# Patient Record
Sex: Female | Born: 1975 | Race: White | Hispanic: No | Marital: Married | Smoking: Never smoker
Health system: Southern US, Community
[De-identification: ages and names within clinical notes are randomized; demographics above are authoritative.]

---

## 2016-01-28 ENCOUNTER — Ambulatory Visit: Payer: Self-pay | Admitting: Family Medicine

## 2016-05-01 ENCOUNTER — Ambulatory Visit: Payer: Self-pay | Admitting: Family Medicine

## 2016-05-09 ENCOUNTER — Ambulatory Visit (INDEPENDENT_AMBULATORY_CARE_PROVIDER_SITE_OTHER): Payer: Self-pay | Admitting: Primary Care

## 2016-05-09 ENCOUNTER — Encounter: Payer: Self-pay | Admitting: Primary Care

## 2016-05-09 ENCOUNTER — Other Ambulatory Visit: Payer: Self-pay | Admitting: Primary Care

## 2016-05-09 VITALS — BP 108/70 | HR 76 | Temp 98.4°F | Ht 63.5 in | Wt 139.8 lb

## 2016-05-09 DIAGNOSIS — Z0001 Encounter for general adult medical examination with abnormal findings: Secondary | ICD-10-CM | POA: Insufficient documentation

## 2016-05-09 DIAGNOSIS — Z803 Family history of malignant neoplasm of breast: Secondary | ICD-10-CM

## 2016-05-09 DIAGNOSIS — Z0000 Encounter for general adult medical examination without abnormal findings: Secondary | ICD-10-CM

## 2016-05-09 DIAGNOSIS — Z1239 Encounter for other screening for malignant neoplasm of breast: Secondary | ICD-10-CM

## 2016-05-09 LAB — TSH: TSH: 0.81 u[IU]/mL (ref 0.35–4.50)

## 2016-05-09 LAB — COMPREHENSIVE METABOLIC PANEL
ALBUMIN: 4.8 g/dL (ref 3.5–5.2)
ALK PHOS: 43 U/L (ref 39–117)
ALT: 12 U/L (ref 0–35)
AST: 20 U/L (ref 0–37)
BILIRUBIN TOTAL: 0.5 mg/dL (ref 0.2–1.2)
BUN: 17 mg/dL (ref 6–23)
CO2: 29 mEq/L (ref 19–32)
Calcium: 9.8 mg/dL (ref 8.4–10.5)
Chloride: 103 mEq/L (ref 96–112)
Creatinine, Ser: 0.84 mg/dL (ref 0.40–1.20)
GFR: 80.37 mL/min (ref >60.00)
GLUCOSE: 87 mg/dL (ref 70–99)
Potassium: 4.6 mEq/L (ref 3.5–5.1)
Sodium: 139 mEq/L (ref 135–145)
TOTAL PROTEIN: 7.7 g/dL (ref 6.0–8.3)

## 2016-05-09 NOTE — Progress Notes (Signed)
Subjective:    Patient ID: Makayla Perry, female    DOB: [birth year], 40 y.o.   MRN: [id number]  HPI  Makayla Perry is a 40 year old female who presents today to establish care and for complete physical. Will obtain old records.  1) Family History of Breast Cancer: Mother diagnosed with breast cancer at age 38. Patient completed mammogram at age 40. She completed genetic testing 3 years ago which was negative.   Immunizations: -Tetanus: Unsure, believes it's been over 10 years. -Influenza: Declines   Diet: She endorses a healthy diet. Breakfast: Hard boiled eggs, fruit Lunch: Chicken meat, vegetables, fruit Dinner: Chicken and broccoli  Snacks: Trail mix bar, protein shake, chips Desserts: Ice cream 3-4 times weekly Beverages: Unsweet tea, water, occasional diet coke  Exercise: She works out 5 days weekly Eye exam: Completed in 2016, no changes in vision Dental exam: Completes semi-annually Pap Smear: Completed in 2014, normal. Due and reschedule. Mammogram: Completed 2009, normal.   Review of Systems  Constitutional: Negative for unexpected weight change.  HENT: Negative for rhinorrhea.   Respiratory: Negative for cough and shortness of breath.   Cardiovascular: Negative for chest pain.  Gastrointestinal: Negative for constipation and diarrhea.  Genitourinary: Negative for difficulty urinating and menstrual problem.  Musculoskeletal: Negative for arthralgias and myalgias.  Skin: Negative for rash.  Allergic/Immunologic: Negative for environmental allergies.  Neurological: Negative for dizziness, numbness and headaches.  Psychiatric/Behavioral:       Denies concerns for anxiety or depression       No past medical history on file.   Social History   Social History  . Marital status: Married    Spouse name: N/A  . Number of children: N/A  . Years of education: N/A   Occupational History  . Not on file.   Social History Main Topics  . Smoking status: Never  Smoker  . Smokeless tobacco: Not on file  . Alcohol use Yes  . Drug use: Unknown  . Sexual activity: Not on file   Other Topics Concern  . Not on file   Social History Narrative  . No narrative on file    No past surgical history on file.  Family History  Problem Relation Age of Onset  . Breast cancer Mother   . Alcohol abuse Paternal Grandfather   . Lung cancer Paternal Grandfather     No Known Allergies  No current outpatient prescriptions on file prior to visit.   No current facility-administered medications on file prior to visit.     BP 108/70   Pulse 76   Temp 98.4 F (36.9 C) (Oral)   Ht 5' 3.5" (1.613 m)   Wt 139 lb 12.8 oz (63.4 kg)   LMP 05/06/2016   SpO2 97%   BMI 24.38 kg/m    Objective:   Physical Exam  Constitutional: She is oriented to person, place, and time. She appears well-nourished.  HENT:  Right Ear: Tympanic membrane and ear canal normal.  Left Ear: Tympanic membrane and ear canal normal.  Nose: Nose normal.  Mouth/Throat: Oropharynx is clear and moist.  Eyes: Conjunctivae and EOM are normal. Pupils are equal, round, and reactive to light.  Neck: Neck supple. No thyromegaly present.  Cardiovascular: Normal rate and regular rhythm.   No murmur heard. Pulmonary/Chest: Effort normal and breath sounds normal. She has no rales.  Abdominal: Soft. Bowel sounds are normal. There is no tenderness.  Musculoskeletal: Normal range of motion.  Lymphadenopathy:    She  has no cervical adenopathy.  Neurological: She is alert and oriented to person, place, and time. She has normal reflexes. No cranial nerve deficit.  Skin: Skin is warm and dry. No rash noted.  Psychiatric: She has a normal mood and affect.          Assessment & Plan:

## 2016-05-09 NOTE — Assessment & Plan Note (Signed)
Mother diagnosed at age 38. Patient completed mammogram at age 40, normal. Genetic testing completed 3 years ago, negative. Will review current breast cancer screening guidelines to determine need for repeat mammogram.

## 2016-05-09 NOTE — Progress Notes (Signed)
Pre visit review using our clinic review tool, if applicable. No additional management support is needed unless otherwise documented below in the visit note. 

## 2016-05-09 NOTE — Patient Instructions (Addendum)
Continue your efforts towards a healthy lifestyle through healthy diet and routine exercise.  Take the prescription for a tetanus vaccination to [facility].  Schedule your Pap exam at your convenience.  Ensure you are consuming 64 ounces of water daily.  Continue exercising. You should be getting 150 minutes of moderate intensity exercise weekly.  It was a pleasure to meet you today! Please don't hesitate to call me with any questions. Welcome to [facility]!

## 2016-05-09 NOTE — Assessment & Plan Note (Signed)
Td due, Rx provided today. Pap due, patient will schedule second office visit per her preference. Mammogram likely due given family history of breast cancer, will order if necessary. Exam unremarkable. Labs pending. Commended her on her healthy lifestyle. Follow up in 1 year for repeat physical.

## 2016-05-29 ENCOUNTER — Other Ambulatory Visit (HOSPITAL_COMMUNITY)
Admission: RE | Admit: 2016-05-29 | Discharge: 2016-05-29 | Disposition: A | Discharge status: Home / Self Care | Payer: Self-pay | Source: Ambulatory Visit | Attending: Primary Care | Admitting: Primary Care

## 2016-05-29 ENCOUNTER — Encounter: Payer: Self-pay | Admitting: Primary Care

## 2016-05-29 ENCOUNTER — Ambulatory Visit (INDEPENDENT_AMBULATORY_CARE_PROVIDER_SITE_OTHER): Payer: Self-pay | Admitting: Primary Care

## 2016-05-29 VITALS — BP 110/70 | HR 80 | Temp 98.2°F | Ht 64.0 in | Wt 140.8 lb

## 2016-05-29 DIAGNOSIS — Z01411 Encounter for gynecological examination (general) (routine) with abnormal findings: Secondary | ICD-10-CM | POA: Insufficient documentation

## 2016-05-29 DIAGNOSIS — Z1151 Encounter for screening for human papillomavirus (HPV): Secondary | ICD-10-CM | POA: Insufficient documentation

## 2016-05-29 DIAGNOSIS — Z124 Encounter for screening for malignant neoplasm of cervix: Secondary | ICD-10-CM

## 2016-05-29 NOTE — Patient Instructions (Signed)
We will notify you of your pap results once reviewed in 2-3 days.  I will notify you of your mammogram once completed.  Continue monthly self breast exams.  It was a pleasure to see you today!

## 2016-05-29 NOTE — Progress Notes (Signed)
   Subjective:    Patient ID: Makayla Perry, female    DOB: 1976-05-17, 40 y.o.   MRN: [id number]  HPI  Makayla Perry is a 40 year old female who presents today for her pap. She completed her annual physical exam on 05/09/16. She denies vaginal discharge, vaginal itching, urinary symptoms. She endorses regular periods. She will be completing her mammogram soon as she has a family history of breast cancer in her mother. She does complete monthly self breast exams. She otherwise has no complaints.  Review of Systems  Constitutional: Negative for fatigue.  Respiratory: Negative for shortness of breath.   Cardiovascular: Negative for chest pain.  Genitourinary: Negative for dysuria, frequency, menstrual problem, pelvic pain, vaginal bleeding and vaginal discharge.       No past medical history on file.   Social History   Social History  . Marital status: Married    Spouse name: N/A  . Number of children: N/A  . Years of education: N/A   Occupational History  . Not on file.   Social History Main Topics  . Smoking status: Never Smoker  . Smokeless tobacco: Not on file  . Alcohol use Yes  . Drug use: Unknown  . Sexual activity: Not on file   Other Topics Concern  . Not on file   Social History Narrative  . No narrative on file    No past surgical history on file.  Family History  Problem Relation Age of Onset  . Breast cancer Mother   . Alcohol abuse Paternal Grandfather   . Lung cancer Paternal Grandfather     No Known Allergies  No current outpatient prescriptions on file prior to visit.   No current facility-administered medications on file prior to visit.     BP 110/70   Pulse 80   Temp 98.2 F (36.8 C) (Oral)   Ht 5\' 4"  (1.626 m)   Wt 140 lb 12.8 oz (63.9 kg)   LMP 05/06/2016   SpO2 97%   BMI 24.17 kg/m    Objective:   Physical Exam  Constitutional: She appears well-nourished.  Cardiovascular: Normal rate and regular rhythm.   Pulmonary/Chest:  Effort normal and breath sounds normal.  Genitourinary: No breast tenderness. There is no lesion on the right labia. There is no lesion on the left labia. Cervix exhibits no motion tenderness and no discharge. Right adnexum displays no tenderness. Left adnexum displays no tenderness. No vaginal discharge found.          Assessment & Plan:  Encounter for Pap:  Completed 3 years ago, normal. No history of abnormal paps. Will complete mammogram soon given family history of breast cancer. Exam today unremarkable. Stressed importance of continued monthly self breast exams. If Pap negative, will repeat in 3 years.  Morrie Sheldonlark,Katherine Kendal, NP

## 2016-05-29 NOTE — Progress Notes (Signed)
Pre visit review using our clinic review tool, if applicable. No additional management support is needed unless otherwise documented below in the visit note. 

## 2016-05-29 NOTE — Addendum Note (Signed)
Addended by: Tawnya CrookSAMBATH, Jesson Foskey on: 05/29/2016 03:30 PM   Modules accepted: Orders

## 2016-05-31 LAB — CYTOLOGY - PAP
Diagnosis: NEGATIVE
HPV: NOT DETECTED

## 2016-07-04 ENCOUNTER — Other Ambulatory Visit: Payer: Self-pay | Admitting: Primary Care

## 2016-07-04 ENCOUNTER — Ambulatory Visit
Admission: RE | Admit: 2016-07-04 | Discharge: 2016-07-04 | Disposition: A | Discharge status: Home / Self Care | Payer: Self-pay | Source: Ambulatory Visit | Attending: Primary Care | Admitting: Primary Care

## 2016-07-04 DIAGNOSIS — Z1231 Encounter for screening mammogram for malignant neoplasm of breast: Secondary | ICD-10-CM | POA: Insufficient documentation

## 2016-07-04 DIAGNOSIS — R928 Other abnormal and inconclusive findings on diagnostic imaging of breast: Secondary | ICD-10-CM

## 2016-07-04 DIAGNOSIS — N6310 Unspecified lump in the right breast, unspecified quadrant: Secondary | ICD-10-CM

## 2016-07-04 DIAGNOSIS — Z1239 Encounter for other screening for malignant neoplasm of breast: Secondary | ICD-10-CM

## 2016-07-21 ENCOUNTER — Ambulatory Visit
Admission: RE | Admit: 2016-07-21 | Discharge: 2016-07-21 | Disposition: A | Discharge status: Home / Self Care | Payer: Self-pay | Source: Ambulatory Visit | Attending: Primary Care | Admitting: Primary Care

## 2016-07-21 DIAGNOSIS — R928 Other abnormal and inconclusive findings on diagnostic imaging of breast: Secondary | ICD-10-CM | POA: Insufficient documentation

## 2016-07-21 DIAGNOSIS — N6310 Unspecified lump in the right breast, unspecified quadrant: Secondary | ICD-10-CM

## 2017-02-10 IMAGING — MG MM DIGITAL SCREENING BILAT W/ CAD
4 series · 4 of 4 positions shown · non-contrast
Comparison: None.

CLINICAL DATA: Screening.

EXAM:
DIGITAL SCREENING BILATERAL MAMMOGRAM WITH CAD

[R MLO]
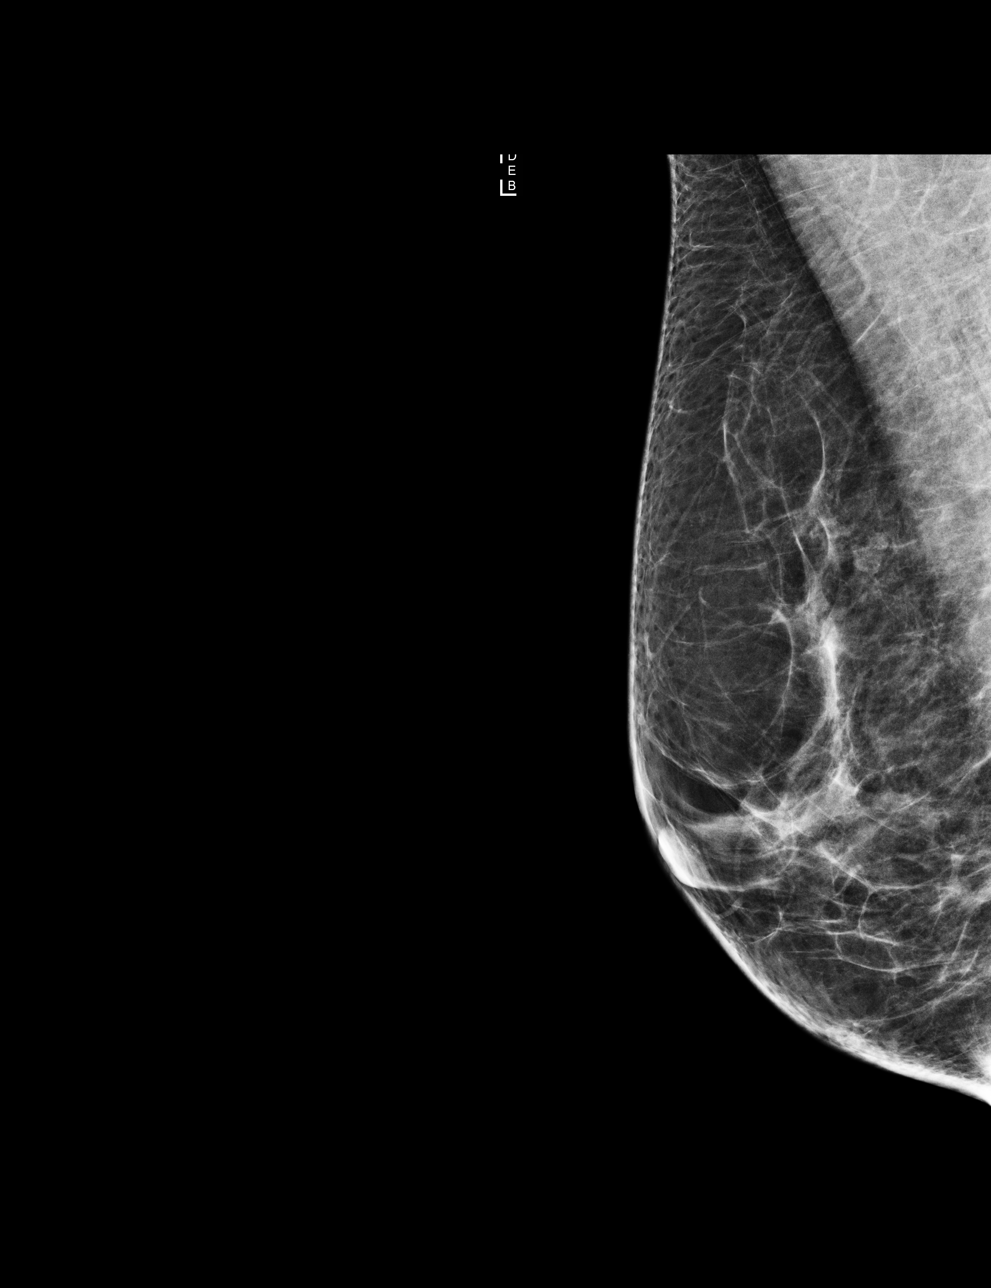

[L CC]
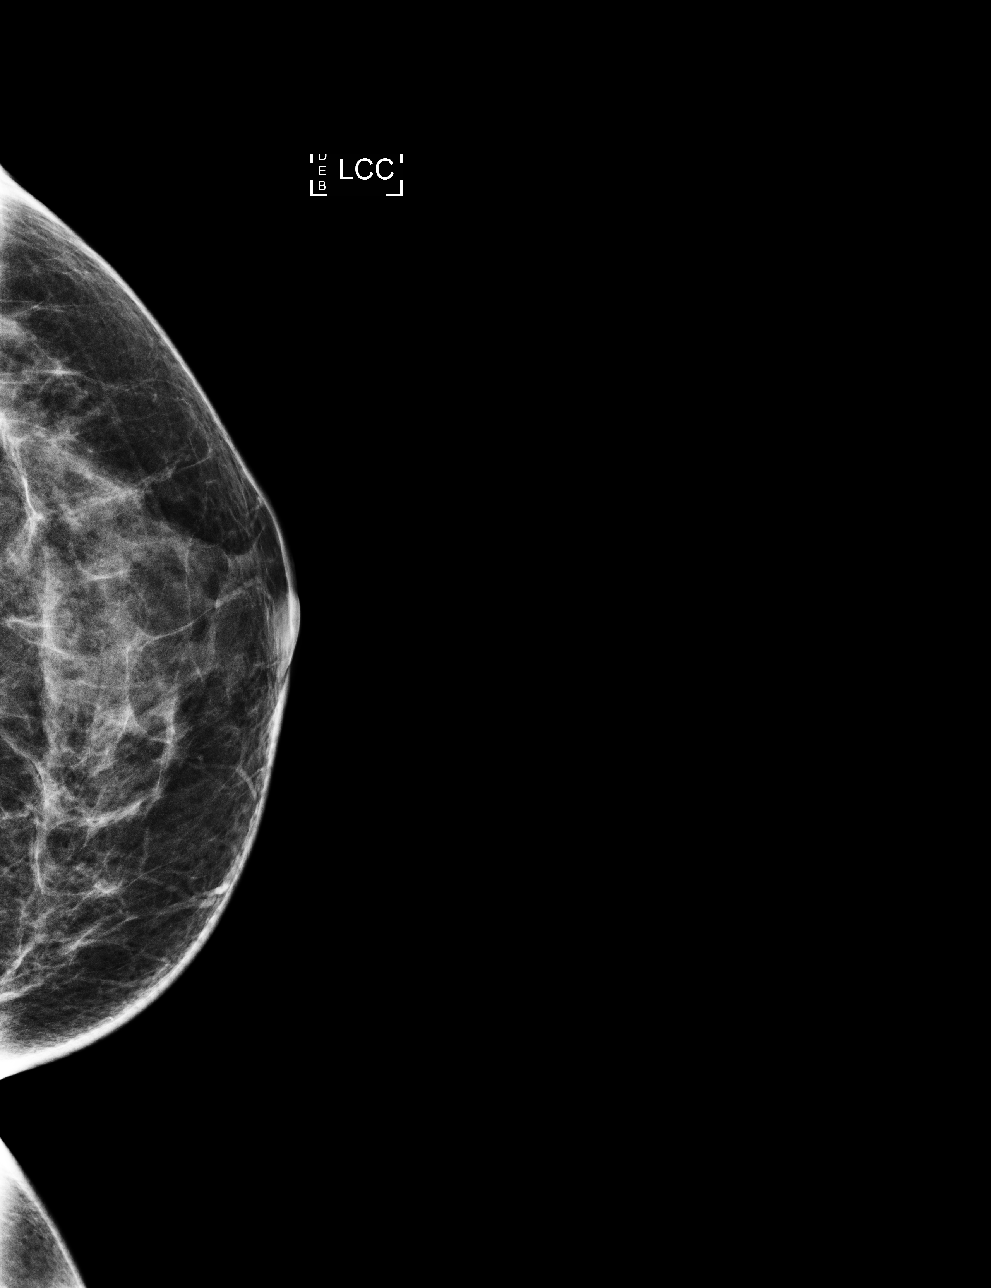

[L MLO]
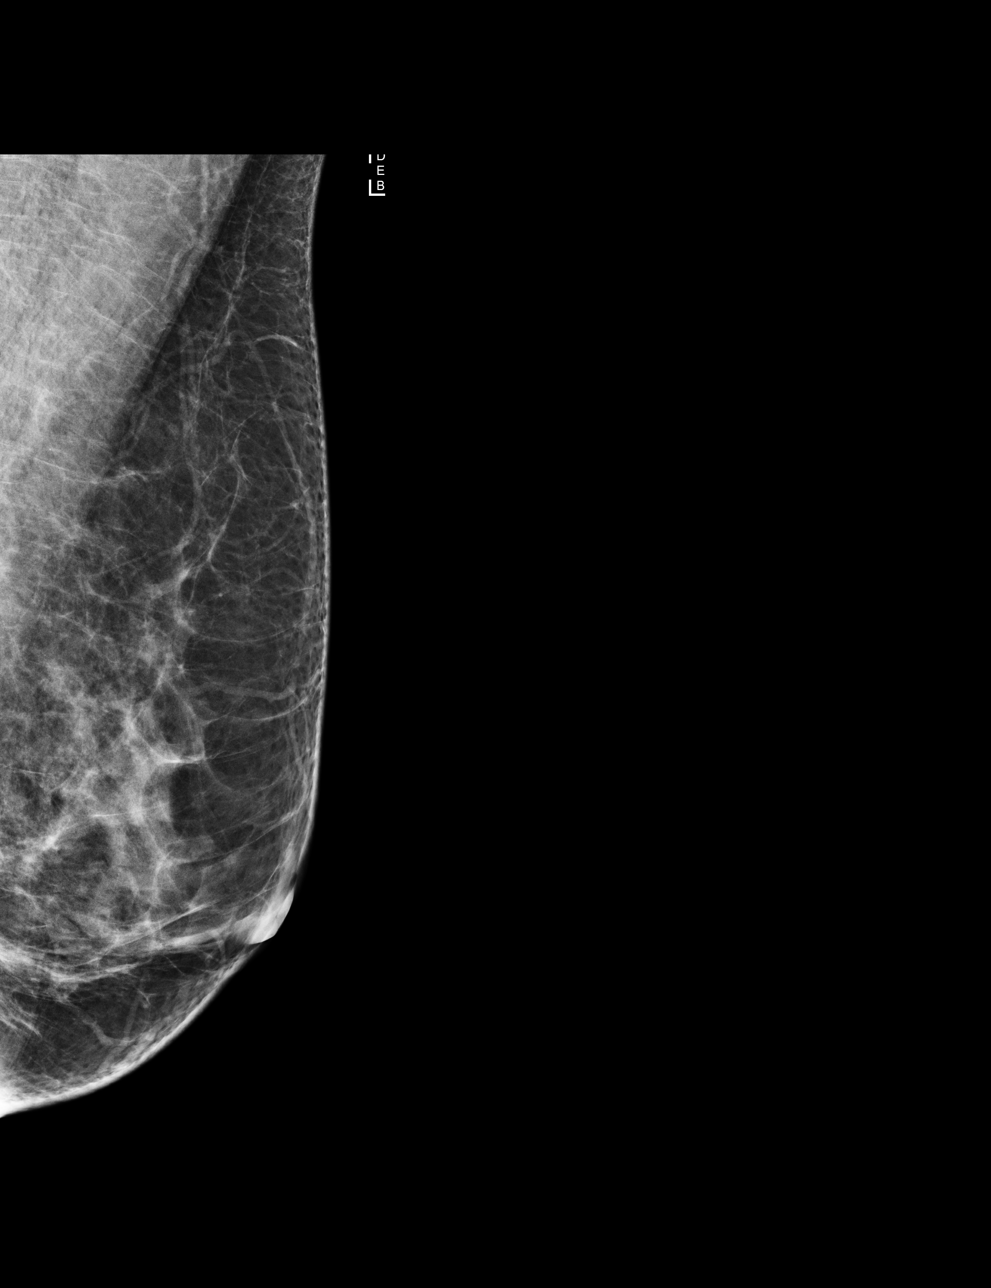

[R CC]
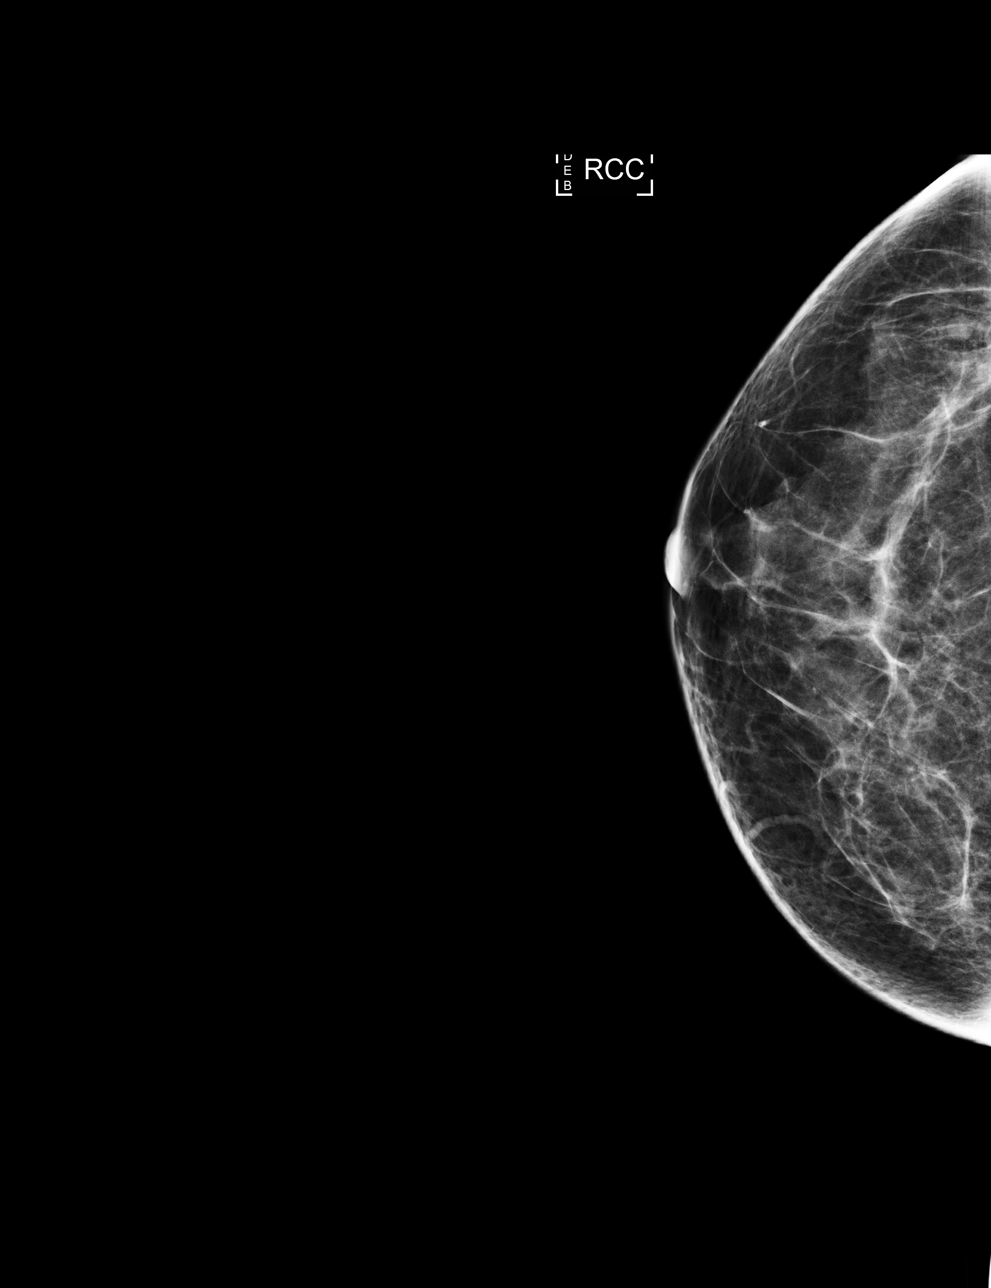

[4 of 4 positions shown; findings below may reference images not displayed]

ACR Breast Density Category c: The breast tissue is heterogeneously
dense, which may obscure small masses.
FINDINGS: In the right breast, a possible mass warrants further evaluation. In
the left breast, no findings suspicious for malignancy. Images were
processed with CAD.
IMPRESSION: Further evaluation is suggested for possible mass in the right
breast.

RECOMMENDATION:
Diagnostic mammogram and possibly ultrasound of the right breast.
(Code:1X-6-RRN)

The patient will be contacted regarding the findings, and additional
imaging will be scheduled.

BI-RADS CATEGORY  0: Incomplete. Need additional imaging evaluation
and/or prior mammograms for comparison.

## 2017-09-24 ENCOUNTER — Telehealth: Payer: Self-pay | Admitting: Primary Care

## 2017-09-24 DIAGNOSIS — Z1239 Encounter for other screening for malignant neoplasm of breast: Secondary | ICD-10-CM

## 2017-09-24 NOTE — Telephone Encounter (Signed)
Copied from CRM [id number]. Topic: Inquiry >> Sep 24, 2017  2:46 PM Maia Pettiesrtiz, Kristie S wrote: Reason for CRM: pt is due for MM and was [facility] that order needs entered by PCP. Please advise.

## 2017-09-24 NOTE — Telephone Encounter (Signed)
I tried call Makayla Perry busy

## 2017-09-25 NOTE — Telephone Encounter (Signed)
I spoke with [place].  Pt needs order [id number] for mammogram

## 2017-09-25 NOTE — Telephone Encounter (Signed)
Have not seen patient in 2 years, please set up for CPE. Mammogram orders placed.

## 2017-09-25 NOTE — Telephone Encounter (Signed)
Mammogram 3/19 cpx 11/11 Pt aware

## 2017-10-09 ENCOUNTER — Ambulatory Visit
Admission: RE | Admit: 2017-10-09 | Discharge: 2017-10-09 | Disposition: A | Discharge status: Home / Self Care | Payer: Self-pay | Source: Ambulatory Visit | Attending: Primary Care | Admitting: Primary Care

## 2017-10-09 DIAGNOSIS — Z1239 Encounter for other screening for malignant neoplasm of breast: Secondary | ICD-10-CM

## 2017-10-09 DIAGNOSIS — Z1231 Encounter for screening mammogram for malignant neoplasm of breast: Secondary | ICD-10-CM | POA: Insufficient documentation

## 2018-01-02 ENCOUNTER — Ambulatory Visit: Payer: Self-pay

## 2018-01-02 NOTE — Telephone Encounter (Signed)
Noted and agree with recommendations.  

## 2018-01-02 NOTE — Telephone Encounter (Signed)
Husband called to report an allergic reaction.  Stated that pt. took dose of Allegra D at approx. 9:00 AM.  Stated about 9:40 AM, she started having rash on face and neck, with swelling of eyes and lips, and tongue.  Reported wife is with him in the car.  Advised she needs to take a dose of Benadryl right away.  Reported she just took the Benadryl.  Questioned if she is breathing okay, or in any distress?  Per husband, "she is fine"  Advised to go to nearest ER or UC.  Husband agreed, and verb. understanding of the immediate need for her evaluation.     Reason for Disposition . Swollen tongue  Answer Assessment - Initial Assessment Questions 1. SYMPTOMS: "Do you have any symptoms?"     Rash on face and neck with mouth and tongue swelling 2. SEVERITY: If symptoms are present, ask "Are they mild, moderate or severe?"     Reported reaction to Allegra D.  Took medication at 9:00 AM and had onset of rash of face and neck with swelling of eyes, mouth and tongue at 9:40 AM  Answer Assessment - Initial Assessment Questions 1. APPEARANCE of RASH: "Describe the rash." (e.g., spots, blisters, raised areas, skin peeling, scaly)     Rash of face, neck, with swelling of eyes, mouth, tongue 2. SIZE: "How big are the spots?" (e.g., tip of pen, eraser, coin; inches, centimeters)     small 3. LOCATION: "Where is the rash located?"     Face, neck,  4. COLOR: "What color is the rash?" (Note: It is difficult to assess rash color in people with darker-colored skin. When this situation occurs, simply ask the caller to describe what they see.)     red 5. ONSET: "When did the rash begin?"     About 9:40 AM 6. FEVER: "Do you have a fever?" If so, ask: "What is your temperature, how was it measured, and when did it start?"    Not asked 7. ITCHING: "Does the rash itch?" If so, ask: "How bad is the itch?" (Scale 1-10; or mild, moderate, severe)     Not asked 8. CAUSE: "What do you think is causing the rash?"  Recently took one dose of Allegra D 9. NEW MEDICATION: "What new medication are you taking?" (e.g., name of antibiotic) "When did you start taking this medication?".     Allegra D 10. OTHER SYMPTOMS: "Do you have any other symptoms?" (e.g., sore throat, fever, joint pain)       Swelling of eyes, mouth, tongue 11. PREGNANCY: "Is there any chance you are pregnant?" "When was your last menstrual period?"       Not asked  Protocols used: RASH - WIDESPREAD ON DRUGS-A-AH, MEDICATION QUESTION CALL-A-AH

## 2018-05-23 ENCOUNTER — Other Ambulatory Visit: Payer: Self-pay | Admitting: Primary Care

## 2018-05-23 DIAGNOSIS — Z0000 Encounter for general adult medical examination without abnormal findings: Secondary | ICD-10-CM

## 2018-05-29 ENCOUNTER — Other Ambulatory Visit (INDEPENDENT_AMBULATORY_CARE_PROVIDER_SITE_OTHER): Payer: Self-pay

## 2018-05-29 DIAGNOSIS — Z0000 Encounter for general adult medical examination without abnormal findings: Secondary | ICD-10-CM

## 2018-05-29 LAB — LIPID PANEL
CHOLESTEROL: 166 mg/dL (ref 0–200)
HDL: 65.9 mg/dL (ref >39.00)
LDL Cholesterol: 85 mg/dL (ref 0–99)
NonHDL: 99.61
TRIGLYCERIDES: 71 mg/dL (ref 0.0–149.0)
Total CHOL/HDL Ratio: 3
VLDL: 14.2 mg/dL (ref 0.0–40.0)

## 2018-05-29 LAB — COMPREHENSIVE METABOLIC PANEL
ALBUMIN: 4.4 g/dL (ref 3.5–5.2)
ALT: 18 U/L (ref 0–35)
AST: 23 U/L (ref 0–37)
Alkaline Phosphatase: 38 U/L — ABNORMAL LOW (ref 39–117)
BUN: 15 mg/dL (ref 6–23)
CALCIUM: 9.3 mg/dL (ref 8.4–10.5)
CO2: 27 mEq/L (ref 19–32)
CREATININE: 0.81 mg/dL (ref 0.40–1.20)
Chloride: 103 mEq/L (ref 96–112)
GFR: 82.94 mL/min (ref >60.00)
Glucose, Bld: 81 mg/dL (ref 70–99)
Potassium: 3.8 mEq/L (ref 3.5–5.1)
Sodium: 138 mEq/L (ref 135–145)
Total Bilirubin: 0.6 mg/dL (ref 0.2–1.2)
Total Protein: 6.8 g/dL (ref 6.0–8.3)

## 2018-06-03 ENCOUNTER — Encounter: Payer: Self-pay | Admitting: Primary Care

## 2018-06-03 ENCOUNTER — Ambulatory Visit: Payer: Self-pay | Admitting: Primary Care

## 2018-06-03 VITALS — BP 110/70 | HR 83 | Temp 98.2°F | Ht 64.0 in | Wt 133.5 lb

## 2018-06-03 DIAGNOSIS — Z0000 Encounter for general adult medical examination without abnormal findings: Secondary | ICD-10-CM

## 2018-06-03 DIAGNOSIS — Z803 Family history of malignant neoplasm of breast: Secondary | ICD-10-CM

## 2018-06-03 NOTE — Assessment & Plan Note (Signed)
Mammogram in March 2019 unremarkable, due again in March 2020.

## 2018-06-03 NOTE — Progress Notes (Signed)
Subjective:    Patient ID: Makayla Perry, female    DOB: October 02, 1975, 42 y.o.   MRN: [id number]  HPI  Makayla Perry is a 42 year old female who presents today for complete physical.  Immunizations: -Tetanus: Unsure, believes within 10 years.  -Influenza: Declines  Diet: She endorses a fair diet. Breakfast: Hot tea, carb free muffin Lunch: Protein, vegetables Dinner: Protein, vegetables Snacks: Nut butter Desserts: None Beverages: Water  Exercise: She exercises 7 days weekly  Eye exam: No recent exam  Dental exam: Completes semi-annually Pap Smear: Completed in 2017 Mammogram: Completed in March 2019   BP Readings from Last 3 Encounters:  06/03/18 110/70  05/29/16 110/70  05/09/16 108/70     Review of Systems  Constitutional: Negative for unexpected weight change.  HENT: Negative for rhinorrhea.   Respiratory: Negative for cough and shortness of breath.   Cardiovascular: Negative for chest pain.  Gastrointestinal: Negative for constipation and diarrhea.  Genitourinary: Negative for difficulty urinating and menstrual problem.  Musculoskeletal: Negative for arthralgias and myalgias.  Skin: Negative for rash.  Allergic/Immunologic: Negative for environmental allergies.  Neurological: Negative for dizziness, numbness and headaches.  Psychiatric/Behavioral: The patient is not nervous/anxious.        No past medical history on file.   Social History   Socioeconomic History  . Marital status: Married    Spouse name: Not on file  . Number of children: Not on file  . Years of education: Not on file  . Highest education level: Not on file  Occupational History  . Not on file  Social Needs  . Financial resource strain: Not on file  . Food insecurity:    Worry: Not on file    Inability: Not on file  . Transportation needs:    Medical: Not on file    Non-medical: Not on file  Tobacco Use  . Smoking status: Never Smoker  . Smokeless tobacco: Never Used    Substance and Sexual Activity  . Alcohol use: Yes  . Drug use: Not on file  . Sexual activity: Not on file  Lifestyle  . Physical activity:    Days per week: Not on file    Minutes per session: Not on file  . Stress: Not on file  Relationships  . Social connections:    Talks on phone: Not on file    Gets together: Not on file    Attends religious service: Not on file    Active member of club or organization: Not on file    Attends meetings of clubs or organizations: Not on file    Relationship status: Not on file  . Intimate partner violence:    Fear of current or ex partner: Not on file    Emotionally abused: Not on file    Physically abused: Not on file    Forced sexual activity: Not on file  Other Topics Concern  . Not on file  Social History Narrative  . Not on file    No past surgical history on file.  Family History  Problem Relation Age of Onset  . Breast cancer Mother 57  . Alcohol abuse Paternal Grandfather   . Lung cancer Paternal Grandfather     No Known Allergies  No current outpatient medications on file prior to visit.   No current facility-administered medications on file prior to visit.     BP 110/70   Pulse 83   Temp 98.2 F (36.8 C) (Oral)   Ht  5\' 4"  (1.626 m)   Wt 133 lb 8 oz (60.6 kg)   LMP 05/30/2018   SpO2 99%   BMI 22.92 kg/m    Objective:   Physical Exam  Constitutional: She is oriented to person, place, and time. She appears well-nourished.  HENT:  Mouth/Throat: No oropharyngeal exudate.  Eyes: Pupils are equal, round, and reactive to light. EOM are normal.  Neck: Neck supple. No thyromegaly present.  Cardiovascular: Normal rate and regular rhythm.  Respiratory: Effort normal and breath sounds normal.  GI: Soft. Bowel sounds are normal. There is no tenderness.  Musculoskeletal: Normal range of motion.  Neurological: She is alert and oriented to person, place, and time.  Skin: Skin is warm and dry.  Psychiatric: She has a  normal mood and affect.           Assessment & Plan:

## 2018-06-03 NOTE — Assessment & Plan Note (Signed)
Immunizations UTD, declines influenza vaccination.  Pap smear and mammogram UTD. Commended her on a healthy lifestyle, encouraged to continue.  Exam unremarkable.  Labs reviewed.  Follow up in 1 year for CPE.

## 2018-06-03 NOTE — Patient Instructions (Signed)
Continue exercising. You should be getting 150 minutes of moderate intensity exercise weekly.  Continue to work on [employer], congratulations on your healthy lifestyle.   Ensure you are consuming 64 ounces of water daily.  We will see you next year for your annual exam or sooner if needed.  It was a pleasure to see you today!   Preventive Care 40-64 Years, Female Preventive care refers to lifestyle choices and visits with your health care provider that can promote health and wellness. What does preventive care include?  A yearly physical exam. This is also called an annual well check.  Dental exams once or twice a year.  Routine eye exams. Ask your health care provider how often you should have your eyes checked.  Personal lifestyle choices, including: ? Daily care of your teeth and gums. ? Regular physical activity. ? Eating a healthy diet. ? Avoiding tobacco and drug use. ? Limiting alcohol use. ? Practicing safe sex. ? Taking low-dose aspirin daily starting at age 61. ? Taking vitamin and mineral supplements as recommended by your health care provider. What happens during an annual well check? The services and screenings done by your health care provider during your annual well check will depend on your age, overall health, lifestyle risk factors, and family history of disease. Counseling Your health care provider may ask you questions about your:  Alcohol use.  Tobacco use.  Drug use.  Emotional well-being.  Home and relationship well-being.  Sexual activity.  Eating habits.  Work and work Statistician.  Method of birth control.  Menstrual cycle.  Pregnancy history.  Screening You may have the following tests or measurements:  Height, weight, and BMI.  Blood pressure.  Lipid and cholesterol levels. These may be checked every 5 years, or more frequently if you are over 83 years old.  Skin check.  Lung cancer screening. You may have this screening  every year starting at age 44 if you have a 30-pack-year history of smoking and currently smoke or have quit within the past 15 years.  Fecal occult blood test (FOBT) of the stool. You may have this test every year starting at age 56.  Flexible sigmoidoscopy or colonoscopy. You may have a sigmoidoscopy every 5 years or a colonoscopy every 10 years starting at age 69.  Hepatitis C blood test.  Hepatitis B blood test.  Sexually transmitted disease (STD) testing.  Diabetes screening. This is done by checking your blood sugar (glucose) after you have not eaten for a while (fasting). You may have this done every 1-3 years.  Mammogram. This may be done every 1-2 years. Talk to your health care provider about when you should start having regular mammograms. This may depend on whether you have a family history of breast cancer.  BRCA-related cancer screening. This may be done if you have a family history of breast, ovarian, tubal, or peritoneal cancers.  Pelvic exam and Pap test. This may be done every 3 years starting at age 55. Starting at age 21, this may be done every 5 years if you have a Pap test in combination with an HPV test.  Bone density scan. This is done to screen for osteoporosis. You may have this scan if you are at high risk for osteoporosis.  Discuss your test results, treatment options, and if necessary, the need for more tests with your health care provider. Vaccines Your health care provider may recommend certain vaccines, such as:  Influenza vaccine. This is recommended every year.  Tetanus, diphtheria, and acellular pertussis (Tdap, Td) vaccine. You may need a Td booster every 10 years.  Varicella vaccine. You may need this if you have not been vaccinated.  Zoster vaccine. You may need this after age 62.  Measles, mumps, and rubella (MMR) vaccine. You may need at least one dose of MMR if you were born in 1957 or later. You may also need a second dose.  Pneumococcal  13-valent conjugate (PCV13) vaccine. You may need this if you have certain conditions and were not previously vaccinated.  Pneumococcal polysaccharide (PPSV23) vaccine. You may need one or two doses if you smoke cigarettes or if you have certain conditions.  Meningococcal vaccine. You may need this if you have certain conditions.  Hepatitis A vaccine. You may need this if you have certain conditions or if you travel or work in places where you may be exposed to hepatitis A.  Hepatitis B vaccine. You may need this if you have certain conditions or if you travel or work in places where you may be exposed to hepatitis B.  Haemophilus influenzae type b (Hib) vaccine. You may need this if you have certain conditions.  Talk to your health care provider about which screenings and vaccines you need and how often you need them. This information is not intended to replace advice given to you by your health care provider. Make sure you discuss any questions you have with your health care provider. Document Released: 08/06/2015 Document Revised: 03/29/2016 Document Reviewed: 05/11/2015 Elsevier Interactive Patient Education  [employer].

## 2019-06-02 ENCOUNTER — Other Ambulatory Visit: Payer: Self-pay | Admitting: Primary Care

## 2019-06-02 DIAGNOSIS — Z0000 Encounter for general adult medical examination without abnormal findings: Secondary | ICD-10-CM

## 2019-06-04 ENCOUNTER — Other Ambulatory Visit: Payer: Self-pay

## 2019-06-04 ENCOUNTER — Other Ambulatory Visit (INDEPENDENT_AMBULATORY_CARE_PROVIDER_SITE_OTHER): Payer: Self-pay

## 2019-06-04 DIAGNOSIS — Z0000 Encounter for general adult medical examination without abnormal findings: Secondary | ICD-10-CM

## 2019-06-04 LAB — LIPID PANEL
Cholesterol: 182 mg/dL (ref 0–200)
HDL: 77.5 mg/dL (ref >39.00)
LDL Cholesterol: 91 mg/dL (ref 0–99)
NonHDL: 104.88
Total CHOL/HDL Ratio: 2
Triglycerides: 68 mg/dL (ref 0.0–149.0)
VLDL: 13.6 mg/dL (ref 0.0–40.0)

## 2019-06-04 LAB — COMPREHENSIVE METABOLIC PANEL
ALT: 16 U/L (ref 0–35)
AST: 25 U/L (ref 0–37)
Albumin: 4.6 g/dL (ref 3.5–5.2)
Alkaline Phosphatase: 37 U/L — ABNORMAL LOW (ref 39–117)
BUN: 16 mg/dL (ref 6–23)
CO2: 28 mEq/L (ref 19–32)
Calcium: 9.2 mg/dL (ref 8.4–10.5)
Chloride: 100 mEq/L (ref 96–112)
Creatinine, Ser: 0.75 mg/dL (ref 0.40–1.20)
GFR: 84.86 mL/min (ref >60.00)
Glucose, Bld: 92 mg/dL (ref 70–99)
Potassium: 4 mEq/L (ref 3.5–5.1)
Sodium: 136 mEq/L (ref 135–145)
Total Bilirubin: 0.3 mg/dL (ref 0.2–1.2)
Total Protein: 7.2 g/dL (ref 6.0–8.3)

## 2019-06-04 LAB — CBC
HCT: 39.1 % (ref 36.0–46.0)
Hemoglobin: 13.1 g/dL (ref 12.0–15.0)
MCHC: 33.6 g/dL (ref 30.0–36.0)
MCV: 93.9 fl (ref 78.0–100.0)
Platelets: 261 10*3/uL (ref 150.0–400.0)
RBC: 4.16 Mil/uL (ref 3.87–5.11)
RDW: 13.2 % (ref 11.5–15.5)
WBC: 6.4 10*3/uL (ref 4.0–10.5)

## 2019-06-06 ENCOUNTER — Encounter: Payer: Self-pay | Admitting: Primary Care

## 2019-06-06 ENCOUNTER — Other Ambulatory Visit (HOSPITAL_COMMUNITY)
Admission: RE | Admit: 2019-06-06 | Discharge: 2019-06-06 | Disposition: A | Discharge status: Home / Self Care | Payer: Self-pay | Source: Ambulatory Visit | Attending: Primary Care | Admitting: Primary Care

## 2019-06-06 ENCOUNTER — Ambulatory Visit (INDEPENDENT_AMBULATORY_CARE_PROVIDER_SITE_OTHER): Payer: Self-pay | Admitting: Primary Care

## 2019-06-06 ENCOUNTER — Other Ambulatory Visit: Payer: Self-pay

## 2019-06-06 VITALS — BP 98/64 | HR 74 | Temp 97.9°F | Ht 64.0 in | Wt 131.5 lb

## 2019-06-06 DIAGNOSIS — Z1231 Encounter for screening mammogram for malignant neoplasm of breast: Secondary | ICD-10-CM

## 2019-06-06 DIAGNOSIS — Z0000 Encounter for general adult medical examination without abnormal findings: Secondary | ICD-10-CM

## 2019-06-06 DIAGNOSIS — Z124 Encounter for screening for malignant neoplasm of cervix: Secondary | ICD-10-CM | POA: Insufficient documentation

## 2019-06-06 NOTE — Patient Instructions (Signed)
Continue exercising. You should be getting 150 minutes of moderate intensity exercise weekly.  Continue to eat a healthy diet.  We will be in touch with your pap smear results in a few days.  Call to schedule your mammogram.  It was a pleasure to see you today!   Preventive Care 24-41 Years Old, Female Preventive care refers to visits with your health care provider and lifestyle choices that can promote health and wellness. This includes:  A yearly physical exam. This may also be called an annual well check.  Regular dental visits and eye exams.  Immunizations.  Screening for certain conditions.  Healthy lifestyle choices, such as eating a healthy diet, getting regular exercise, not using drugs or products that contain nicotine and tobacco, and limiting alcohol use. What can I expect for my preventive care visit? Physical exam Your health care provider will check your:  Height and weight. This may be used to calculate body mass index (BMI), which tells if you are at a healthy weight.  Heart rate and blood pressure.  Skin for abnormal spots. Counseling Your health care provider may ask you questions about your:  Alcohol, tobacco, and drug use.  Emotional well-being.  Home and relationship well-being.  Sexual activity.  Eating habits.  Work and work Statistician.  Method of birth control.  Menstrual cycle.  Pregnancy history. What immunizations do I need?  Influenza (flu) vaccine  This is recommended every year. Tetanus, diphtheria, and pertussis (Tdap) vaccine  You may need a Td booster every 10 years. Varicella (chickenpox) vaccine  You may need this if you have not been vaccinated. Zoster (shingles) vaccine  You may need this after age 37. Measles, mumps, and rubella (MMR) vaccine  You may need at least one dose of MMR if you were born in 1957 or later. You may also need a second dose. Pneumococcal conjugate (PCV13) vaccine  You may need this if  you have certain conditions and were not previously vaccinated. Pneumococcal polysaccharide (PPSV23) vaccine  You may need one or two doses if you smoke cigarettes or if you have certain conditions. Meningococcal conjugate (MenACWY) vaccine  You may need this if you have certain conditions. Hepatitis A vaccine  You may need this if you have certain conditions or if you travel or work in places where you may be exposed to hepatitis A. Hepatitis B vaccine  You may need this if you have certain conditions or if you travel or work in places where you may be exposed to hepatitis B. Haemophilus influenzae type b (Hib) vaccine  You may need this if you have certain conditions. Human papillomavirus (HPV) vaccine  If recommended by your health care provider, you may need three doses over 6 months. You may receive vaccines as individual doses or as more than one vaccine together in one shot (combination vaccines). Talk with your health care provider about the risks and benefits of combination vaccines. What tests do I need? Blood tests  Lipid and cholesterol levels. These may be checked every 5 years, or more frequently if you are over 33 years old.  Hepatitis C test.  Hepatitis B test. Screening  Lung cancer screening. You may have this screening every year starting at age 63 if you have a 30-pack-year history of smoking and currently smoke or have quit within the past 15 years.  Colorectal cancer screening. All adults should have this screening starting at age 55 and continuing until age 33. Your health care provider may recommend  screening at age 69 if you are at increased risk. You will have tests every 1-10 years, depending on your results and the type of screening test.  Diabetes screening. This is done by checking your blood sugar (glucose) after you have not eaten for a while (fasting). You may have this done every 1-3 years.  Mammogram. This may be done every 1-2 years. Talk with  your health care provider about when you should start having regular mammograms. This may depend on whether you have a family history of breast cancer.  BRCA-related cancer screening. This may be done if you have a family history of breast, ovarian, tubal, or peritoneal cancers.  Pelvic exam and Pap test. This may be done every 3 years starting at age 11. Starting at age 51, this may be done every 5 years if you have a Pap test in combination with an HPV test. Other tests  Sexually transmitted disease (STD) testing.  Bone density scan. This is done to screen for osteoporosis. You may have this scan if you are at high risk for osteoporosis. Follow these instructions at home: Eating and drinking  Eat a diet that includes fresh fruits and vegetables, whole grains, lean protein, and low-fat dairy.  Take vitamin and mineral supplements as recommended by your health care provider.  Do not drink alcohol if: ? Your health care provider tells you not to drink. ? You are pregnant, may be pregnant, or are planning to become pregnant.  If you drink alcohol: ? Limit how much you have to 0-1 drink a day. ? Be aware of how much alcohol is in your drink. In the [place] one drink equals one 12 oz bottle of beer (355 mL), one 5 oz glass of wine (148 mL), or one 1 oz glass of hard liquor (44 mL). Lifestyle  Take daily care of your teeth and gums.  Stay active. Exercise for at least 30 minutes on 5 or more days each week.  Do not use any products that contain nicotine or tobacco, such as cigarettes, e-cigarettes, and chewing tobacco. If you need help quitting, ask your health care provider.  If you are sexually active, practice safe sex. Use a condom or other form of birth control (contraception) in order to prevent pregnancy and STIs (sexually transmitted infections).  If told by your health care provider, take low-dose aspirin daily starting at age 45. What's next?  Visit your health care provider  once a year for a well check visit.  Ask your health care provider how often you should have your eyes and teeth checked.  Stay up to date on all vaccines. This information is not intended to replace advice given to you by your health care provider. Make sure you discuss any questions you have with your health care provider. Document Released: 08/06/2015 Document Revised: 03/21/2018 Document Reviewed: 03/21/2018 Elsevier Patient Education  2020 [employer].

## 2019-06-06 NOTE — Progress Notes (Signed)
Subjective:    Patient ID: Kallie Locks, female    DOB: May 13, 1976, 43 y.o.   MRN: [id number]  HPI  Ms. Brands is a 43 year old female who presents today for complete physical.  Immunizations: -Tetanus: Completed in 2014 -Influenza: Declines  Diet: She endorses a healthy diet. She is eating a Keto Diet. Drinking water and unsweet tea. Exercise: She is exercising daily, beach body on demand  Eye exam: Completed several years ago Dental exam: Completes semi-annually   Pap Smear: Completed in 2017 Mammogram: Completed in 2019  BP Readings from Last 3 Encounters:  06/06/19 98/64  06/03/18 110/70  05/29/16 110/70     Review of Systems  Constitutional: Negative for unexpected weight change.  HENT: Negative for rhinorrhea.   Respiratory: Negative for cough and shortness of breath.   Cardiovascular: Negative for chest pain.  Gastrointestinal: Negative for constipation and diarrhea.  Genitourinary: Negative for difficulty urinating and menstrual problem.  Musculoskeletal: Negative for arthralgias and myalgias.  Skin: Negative for rash.  Allergic/Immunologic: Negative for environmental allergies.  Neurological: Negative for dizziness, numbness and headaches.  Psychiatric/Behavioral: The patient is not nervous/anxious.        No past medical history on file.   Social History   Socioeconomic History  . Marital status: Married    Spouse name: Not on file  . Number of children: Not on file  . Years of education: Not on file  . Highest education level: Not on file  Occupational History  . Not on file  Social Needs  . Financial resource strain: Not on file  . Food insecurity    Worry: Not on file    Inability: Not on file  . Transportation needs    Medical: Not on file    Non-medical: Not on file  Tobacco Use  . Smoking status: Never Smoker  . Smokeless tobacco: Never Used  Substance and Sexual Activity  . Alcohol use: Yes  . Drug use: Not on file  . Sexual  activity: Not on file  Lifestyle  . Physical activity    Days per week: Not on file    Minutes per session: Not on file  . Stress: Not on file  Relationships  . Social Musician on phone: Not on file    Gets together: Not on file    Attends religious service: Not on file    Active member of club or organization: Not on file    Attends meetings of clubs or organizations: Not on file    Relationship status: Not on file  . Intimate partner violence    Fear of current or ex partner: Not on file    Emotionally abused: Not on file    Physically abused: Not on file    Forced sexual activity: Not on file  Other Topics Concern  . Not on file  Social History Narrative  . Not on file    No past surgical history on file.  Family History  Problem Relation Age of Onset  . Breast cancer Mother 56  . Alcohol abuse Paternal Grandfather   . Lung cancer Paternal Grandfather     No Known Allergies  No current outpatient medications on file prior to visit.   No current facility-administered medications on file prior to visit.     BP 98/64   Pulse 74   Temp 97.9 F (36.6 C) (Temporal)   Ht 5\' 4"  (1.626 m)   Wt 131 lb 8  oz (59.6 kg)   LMP 05/23/2019   SpO2 100%   BMI 22.57 kg/m    Objective:   Physical Exam  Constitutional: She is oriented to person, place, and time. She appears well-nourished.  HENT:  Right Ear: Tympanic membrane and ear canal normal.  Left Ear: Tympanic membrane and ear canal normal.  Mouth/Throat: Oropharynx is clear and moist.  Eyes: Pupils are equal, round, and reactive to light. EOM are normal.  Neck: Neck supple.  Cardiovascular: Normal rate and regular rhythm.  Respiratory: Effort normal and breath sounds normal.  GI: Soft. Bowel sounds are normal. There is no abdominal tenderness.  Genitourinary: There is no tenderness or lesion on the right labia. There is no tenderness or lesion on the left labia. Cervix exhibits discharge. Cervix  exhibits no motion tenderness. Right adnexum displays no tenderness. Left adnexum displays no tenderness.    No vaginal discharge or erythema.  No erythema in the vagina.    Genitourinary Comments: Mild amount of whitish vaginal discharge, no foul smell.   Musculoskeletal: Normal range of motion.  Neurological: She is alert and oriented to person, place, and time.  Skin: Skin is warm and dry.  Psychiatric: She has a normal mood and affect.           Assessment & Plan:

## 2019-06-06 NOTE — Assessment & Plan Note (Signed)
Tetanus UTD, declines influenza vaccination. Pap smear due, completed and pending. Mammogram due, ordered.  Commended her on a healthy diet and regular exercise. Exam today unremarkable. Labs reviewed. Follow up in 1 year for CPE.

## 2019-06-11 LAB — CYTOLOGY - PAP
Comment: NEGATIVE
Diagnosis: NEGATIVE
High risk HPV: NEGATIVE

## 2020-11-19 ENCOUNTER — Ambulatory Visit (INDEPENDENT_AMBULATORY_CARE_PROVIDER_SITE_OTHER): Payer: Self-pay | Admitting: Primary Care

## 2020-11-19 ENCOUNTER — Other Ambulatory Visit: Payer: Self-pay

## 2020-11-19 ENCOUNTER — Encounter: Payer: Self-pay | Admitting: Primary Care

## 2020-11-19 VITALS — BP 104/76 | HR 90 | Temp 98.0°F | Ht 63.75 in | Wt 132.0 lb

## 2020-11-19 DIAGNOSIS — R232 Flushing: Secondary | ICD-10-CM

## 2020-11-19 DIAGNOSIS — Z1231 Encounter for screening mammogram for malignant neoplasm of breast: Secondary | ICD-10-CM

## 2020-11-19 DIAGNOSIS — Z0001 Encounter for general adult medical examination with abnormal findings: Secondary | ICD-10-CM

## 2020-11-19 DIAGNOSIS — Z803 Family history of malignant neoplasm of breast: Secondary | ICD-10-CM

## 2020-11-19 DIAGNOSIS — N926 Irregular menstruation, unspecified: Secondary | ICD-10-CM

## 2020-11-19 DIAGNOSIS — Z0000 Encounter for general adult medical examination without abnormal findings: Secondary | ICD-10-CM

## 2020-11-19 LAB — COMPREHENSIVE METABOLIC PANEL
ALT: 12 U/L (ref 0–35)
AST: 19 U/L (ref 0–37)
Albumin: 4.9 g/dL (ref 3.5–5.2)
Alkaline Phosphatase: 35 U/L — ABNORMAL LOW (ref 39–117)
BUN: 22 mg/dL (ref 6–23)
CO2: 27 mEq/L (ref 19–32)
Calcium: 10.1 mg/dL (ref 8.4–10.5)
Chloride: 102 mEq/L (ref 96–112)
Creatinine, Ser: 0.72 mg/dL (ref 0.40–1.20)
GFR: 102.58 mL/min (ref >60.00)
Glucose, Bld: 86 mg/dL (ref 70–99)
Potassium: 4.7 mEq/L (ref 3.5–5.1)
Sodium: 136 mEq/L (ref 135–145)
Total Bilirubin: 0.5 mg/dL (ref 0.2–1.2)
Total Protein: 7.4 g/dL (ref 6.0–8.3)

## 2020-11-19 LAB — LUTEINIZING HORMONE: LH: 12.73 m[IU]/mL

## 2020-11-19 LAB — CBC
HCT: 40.9 % (ref 36.0–46.0)
Hemoglobin: 13.8 g/dL (ref 12.0–15.0)
MCHC: 33.7 g/dL (ref 30.0–36.0)
MCV: 94.2 fl (ref 78.0–100.0)
Platelets: 251 10*3/uL (ref 150.0–400.0)
RBC: 4.34 Mil/uL (ref 3.87–5.11)
RDW: 12.8 % (ref 11.5–15.5)
WBC: 7 10*3/uL (ref 4.0–10.5)

## 2020-11-19 LAB — LIPID PANEL
Cholesterol: 182 mg/dL (ref 0–200)
HDL: 67.1 mg/dL (ref >39.00)
LDL Cholesterol: 90 mg/dL (ref 0–99)
NonHDL: 114.52
Total CHOL/HDL Ratio: 3
Triglycerides: 124 mg/dL (ref 0.0–149.0)
VLDL: 24.8 mg/dL (ref 0.0–40.0)

## 2020-11-19 LAB — FOLLICLE STIMULATING HORMONE: FSH: 10.3 m[IU]/mL

## 2020-11-19 LAB — TSH: TSH: 1.83 u[IU]/mL (ref 0.35–4.50)

## 2020-11-19 NOTE — Patient Instructions (Signed)
Stop by the lab prior to leaving today. I will notify you of your results once received.   [facility].   It was a pleasure to see you today!   Preventive Care 57-43 Years Old, Female Preventive care refers to lifestyle choices and visits with your health care provider that can promote health and wellness. This includes:  A yearly physical exam. This is also called an annual wellness visit.  Regular dental and eye exams.  Immunizations.  Screening for certain conditions.  Healthy lifestyle choices, such as: ? Eating a healthy diet. ? Getting regular exercise. ? Not using drugs or products that contain nicotine and tobacco. ? Limiting alcohol use. What can I expect for my preventive care visit? Physical exam Your health care provider will check your:  Height and weight. These may be used to calculate your BMI (body mass index). BMI is a measurement that tells if you are at a healthy weight.  Heart rate and blood pressure.  Body temperature.  Skin for abnormal spots. Counseling Your health care provider may ask you questions about your:  Past medical problems.  Family's medical history.  Alcohol, tobacco, and drug use.  Emotional well-being.  Home life and relationship well-being.  Sexual activity.  Diet, exercise, and sleep habits.  Work and work Statistician.  Access to firearms.  Method of birth control.  Menstrual cycle.  Pregnancy history. What immunizations do I need? Vaccines are usually given at various ages, according to a schedule. Your health care provider will recommend vaccines for you based on your age, medical history, and lifestyle or other factors, such as travel or where you work.   What tests do I need? Blood tests  Lipid and cholesterol levels. These may be checked every 5 years, or more often if you are over 48 years old.  Hepatitis C test.  Hepatitis B test. Screening  Lung cancer  screening. You may have this screening every year starting at age 62 if you have a 30-pack-year history of smoking and currently smoke or have quit within the past 15 years.  Colorectal cancer screening. ? All adults should have this screening starting at age 52 and continuing until age 55. ? Your health care provider may recommend screening at age 23 if you are at increased risk. ? You will have tests every 1-10 years, depending on your results and the type of screening test.  Diabetes screening. ? This is done by checking your blood sugar (glucose) after you have not eaten for a while (fasting). ? You may have this done every 1-3 years.  Mammogram. ? This may be done every 1-2 years. ? Talk with your health care provider about when you should start having regular mammograms. This may depend on whether you have a family history of breast cancer.  BRCA-related cancer screening. This may be done if you have a family history of breast, ovarian, tubal, or peritoneal cancers.  Pelvic exam and Pap test. ? This may be done every 3 years starting at age 47. ? Starting at age 59, this may be done every 5 years if you have a Pap test in combination with an HPV test. Other tests  STD (sexually transmitted disease) testing, if you are at risk.  Bone density scan. This is done to screen for osteoporosis. You may have this scan if you are at high risk for osteoporosis. Talk with your health care provider about your test results, treatment options, and  if necessary, the need for more tests. Follow these instructions at home: Eating and drinking  Eat a diet that includes fresh fruits and vegetables, whole grains, lean protein, and low-fat dairy products.  Take vitamin and mineral supplements as recommended by your health care provider.  Do not drink alcohol if: ? Your health care provider tells you not to drink. ? You are pregnant, may be pregnant, or are planning to become pregnant.  If you  drink alcohol: ? Limit how much you have to 0-1 drink a day. ? Be aware of how much alcohol is in your drink. In the [place] one drink equals one 12 oz bottle of beer (355 mL), one 5 oz glass of wine (148 mL), or one 1 oz glass of hard liquor (44 mL).   Lifestyle  Take daily care of your teeth and gums. Brush your teeth every morning and night with fluoride toothpaste. Floss one time each day.  Stay active. Exercise for at least 30 minutes 5 or more days each week.  Do not use any products that contain nicotine or tobacco, such as cigarettes, e-cigarettes, and chewing tobacco. If you need help quitting, ask your health care provider.  Do not use drugs.  If you are sexually active, practice safe sex. Use a condom or other form of protection to prevent STIs (sexually transmitted infections).  If you do not wish to become pregnant, use a form of birth control. If you plan to become pregnant, see your health care provider for a prepregnancy visit.  If told by your health care provider, take low-dose aspirin daily starting at age 27.  Find healthy ways to cope with stress, such as: ? Meditation, yoga, or listening to music. ? Journaling. ? Talking to a trusted person. ? Spending time with friends and family. Safety  Always wear your seat belt while driving or riding in a vehicle.  Do not drive: ? If you have been drinking alcohol. Do not ride with someone who has been drinking. ? When you are tired or distracted. ? While texting.  Wear a helmet and other protective equipment during sports activities.  If you have firearms in your house, make sure you follow all gun safety procedures. What's next?  Visit your health care provider once a year for an annual wellness visit.  Ask your health care provider how often you should have your eyes and teeth checked.  Stay up to date on all vaccines. This information is not intended to replace advice given to you by your health care provider.  Make sure you discuss any questions you have with your health care provider. Document Revised: 04/13/2020 Document Reviewed: 03/21/2018 Elsevier Patient Education  2021 [employer].

## 2020-11-19 NOTE — Assessment & Plan Note (Signed)
Tetanus UTD, declines Covid vaccines. Pap smear UTD, due in 2023. Mammogram due, orders placed.  Commended her on regular exercise and a healthy diet.  Exam today benign. Labs pending.

## 2020-11-19 NOTE — Assessment & Plan Note (Signed)
Chronic but with regular cycles.  Could be peri-menopause, will also check labs. Consider vaginal ultrasound if labs are unremarkable, especially given FH of breast cancer.

## 2020-11-19 NOTE — Progress Notes (Signed)
Subjective:    Patient ID: Makayla Perry, female    DOB: [birth year], 45 y.o.   MRN: [id number]  HPI  Makayla Perry is a very pleasant 45 y.o. female who presents today for complete physical.  She would also like to discuss hot flashes and changes to menses. Over the last 1.5 years she's had hot flashes (worse since Fall 2021) and inconsistent menses (heavy/spotting). She's unsure when her mother went through menopause as she had a hysterectomy in her 52's due to breast cancer history. Menses occurs monthly lasting 4-5 days, sometimes spotting, sometimes heavy. She denies breakthrough bleeding, family history of uterine/cervical cancer.   Immunizations: -Tetanus: 2014 -Influenza: Did not receive this season  -Covid-19: Has not completed  Diet: She endorses a healthy diet.  Exercise: She exercises 7 days weekly   Eye exam: No recent exam Dental exam: Completes semi-annually   Pap Smear: 2020 Mammogram: 2019, due   BP Readings from Last 3 Encounters:  11/19/20 104/76  06/06/19 98/64  06/03/18 110/70       Review of Systems  Constitutional: Negative for unexpected weight change.  HENT: Negative for rhinorrhea.   Eyes: Negative for visual disturbance.  Respiratory: Negative for cough and shortness of breath.   Cardiovascular: Negative for chest pain.  Gastrointestinal: Negative for constipation and diarrhea.  Genitourinary: Positive for menstrual problem. Negative for difficulty urinating.       Hot flashes, irregular menses   Musculoskeletal: Negative for arthralgias.  Skin: Negative for rash.  Allergic/Immunologic: Negative for environmental allergies.  Neurological: Negative for dizziness, numbness and headaches.  Psychiatric/Behavioral: The patient is not nervous/anxious.          History reviewed. No pertinent past medical history.  Social History   Socioeconomic History  . Marital status: Married    Spouse name: Not on file  . Number of children:  Not on file  . Years of education: Not on file  . Highest education level: Not on file  Occupational History  . Not on file  Tobacco Use  . Smoking status: Never Smoker  . Smokeless tobacco: Never Used  Substance and Sexual Activity  . Alcohol use: Yes  . Drug use: Not on file  . Sexual activity: Not on file  Other Topics Concern  . Not on file  Social History Narrative  . Not on file   Social Determinants of Health   Financial Resource Strain: Not on file  Food Insecurity: Not on file  Transportation Needs: Not on file  Physical Activity: Not on file  Stress: Not on file  Social Connections: Not on file  Intimate Partner Violence: Not on file    History reviewed. No pertinent surgical history.  Family History  Problem Relation Age of Onset  . Breast cancer Mother 7  . Alcohol abuse Paternal Grandfather   . Lung cancer Paternal Grandfather     No Known Allergies  No current outpatient medications on file prior to visit.   No current facility-administered medications on file prior to visit.    BP 104/76   Pulse 90   Temp 98 F (36.7 C)   Ht 5' 3.75" (1.619 m)   Wt 132 lb (59.9 kg)   SpO2 99%   BMI 22.84 kg/m  Objective:   Physical Exam HENT:     Right Ear: Tympanic membrane and ear canal normal.     Left Ear: Tympanic membrane and ear canal normal.     Nose: Nose normal.  Eyes:  Conjunctiva/sclera: Conjunctivae normal.     Pupils: Pupils are equal, round, and reactive to light.  Neck:     Thyroid: No thyromegaly.  Cardiovascular:     Rate and Rhythm: Normal rate and regular rhythm.     Heart sounds: No murmur heard.   Pulmonary:     Effort: Pulmonary effort is normal.     Breath sounds: Normal breath sounds. No rales.  Abdominal:     General: Bowel sounds are normal.     Palpations: Abdomen is soft.     Tenderness: There is no abdominal tenderness.  Musculoskeletal:        General: Normal range of motion.     Cervical back: Neck supple.   Lymphadenopathy:     Cervical: No cervical adenopathy.  Skin:    General: Skin is warm and dry.     Findings: No rash.  Neurological:     Mental Status: She is alert and oriented to person, place, and time.     Cranial Nerves: No cranial nerve deficit.     Deep Tendon Reflexes: Reflexes are normal and symmetric.  Psychiatric:        Mood and Affect: Mood normal.           Assessment & Plan:      This visit occurred during the SARS-CoV-2 public health emergency.  Safety protocols were in place, including screening questions prior to the visit, additional usage of staff PPE, and extensive cleaning of exam room while observing appropriate contact time as indicated for disinfecting solutions.

## 2020-11-19 NOTE — Assessment & Plan Note (Signed)
Chronic for 1.5 years, worse over last 6 months.  Could be peri-menopause although she is on the younger side of this.   Checking hormone levels including estrogen, FSH, LH, and TSH.

## 2020-11-24 ENCOUNTER — Other Ambulatory Visit: Payer: Self-pay

## 2020-11-24 ENCOUNTER — Ambulatory Visit
Admission: RE | Admit: 2020-11-24 | Discharge: 2020-11-24 | Disposition: A | Discharge status: Home / Self Care | Payer: Self-pay | Source: Ambulatory Visit | Attending: Primary Care | Admitting: Primary Care

## 2020-11-24 DIAGNOSIS — Z1231 Encounter for screening mammogram for malignant neoplasm of breast: Secondary | ICD-10-CM | POA: Insufficient documentation

## 2020-11-24 DIAGNOSIS — Z803 Family history of malignant neoplasm of breast: Secondary | ICD-10-CM | POA: Insufficient documentation

## 2020-11-29 LAB — ESTROGENS, TOTAL: Estrogen: 406.2 pg/mL

## 2021-10-27 DIAGNOSIS — S46012A Strain of muscle(s) and tendon(s) of the rotator cuff of left shoulder, initial encounter: Secondary | ICD-10-CM | POA: Diagnosis not present

## 2021-11-23 ENCOUNTER — Encounter: Payer: Self-pay | Admitting: Primary Care

## 2021-11-23 ENCOUNTER — Ambulatory Visit (INDEPENDENT_AMBULATORY_CARE_PROVIDER_SITE_OTHER): Payer: BC Managed Care – PPO | Admitting: Primary Care

## 2021-11-23 VITALS — BP 120/72 | HR 97 | Ht 64.0 in | Wt 133.4 lb

## 2021-11-23 DIAGNOSIS — M25512 Pain in left shoulder: Secondary | ICD-10-CM

## 2021-11-23 DIAGNOSIS — G8929 Other chronic pain: Secondary | ICD-10-CM | POA: Diagnosis not present

## 2021-11-23 DIAGNOSIS — Z0000 Encounter for general adult medical examination without abnormal findings: Secondary | ICD-10-CM | POA: Diagnosis not present

## 2021-11-23 DIAGNOSIS — Z1231 Encounter for screening mammogram for malignant neoplasm of breast: Secondary | ICD-10-CM

## 2021-11-23 LAB — CBC
HCT: 40.9 % (ref 36.0–46.0)
Hemoglobin: 13.8 g/dL (ref 12.0–15.0)
MCHC: 33.7 g/dL (ref 30.0–36.0)
MCV: 95.3 fl (ref 78.0–100.0)
Platelets: 230 10*3/uL (ref 150.0–400.0)
RBC: 4.29 Mil/uL (ref 3.87–5.11)
RDW: 13.1 % (ref 11.5–15.5)
WBC: 7.1 10*3/uL (ref 4.0–10.5)

## 2021-11-23 LAB — COMPREHENSIVE METABOLIC PANEL
ALT: 12 U/L (ref 0–35)
AST: 20 U/L (ref 0–37)
Albumin: 4.8 g/dL (ref 3.5–5.2)
Alkaline Phosphatase: 34 U/L — ABNORMAL LOW (ref 39–117)
BUN: 17 mg/dL (ref 6–23)
CO2: 28 mEq/L (ref 19–32)
Calcium: 9.5 mg/dL (ref 8.4–10.5)
Chloride: 102 mEq/L (ref 96–112)
Creatinine, Ser: 0.8 mg/dL (ref 0.40–1.20)
GFR: 89.76 mL/min (ref >60.00)
Glucose, Bld: 76 mg/dL (ref 70–99)
Potassium: 4.4 mEq/L (ref 3.5–5.1)
Sodium: 138 mEq/L (ref 135–145)
Total Bilirubin: 0.4 mg/dL (ref 0.2–1.2)
Total Protein: 7.3 g/dL (ref 6.0–8.3)

## 2021-11-23 LAB — LIPID PANEL
Cholesterol: 185 mg/dL (ref 0–200)
HDL: 84.9 mg/dL (ref >39.00)
LDL Cholesterol: 86 mg/dL (ref 0–99)
NonHDL: 100.39
Total CHOL/HDL Ratio: 2
Triglycerides: 72 mg/dL (ref 0.0–149.0)
VLDL: 14.4 mg/dL (ref 0.0–40.0)

## 2021-11-23 NOTE — Assessment & Plan Note (Signed)
Following with orthopedics in [place], Delbert Harness, will likely need MRI later this year.  ? ?Overall doing well since cortisone injection.  ?

## 2021-11-23 NOTE — Progress Notes (Signed)
? ?Subjective:  ? ? Patient ID: Makayla Perry, female    DOB: 02-Aug-1975, 46 y.o.   MRN: [id number] ? ?HPI ? ?Makayla Perry is a very pleasant 46 y.o. female who presents today for complete physical and follow up of chronic conditions. ? ?Immunizations: ?-Tetanus: 2014 ?-Influenza: Does not complete ?-Covid-19: Has not completed ? ?Diet: Fair diet.  ?Exercise: Exercises daily ? ?Eye exam: Completed years ago, no concerns  ?Dental exam: Completes semi-annually  ? ?Pap Smear: Completed in November 2020 ?Mammogram: Completed in May 2022 ? ?BP Readings from Last 3 Encounters:  ?11/23/21 120/72  ?11/19/20 104/76  ?06/06/19 98/64  ? ? ? ? ? ? ?Review of Systems  ?Constitutional:  Negative for unexpected weight change.  ?HENT:  Negative for rhinorrhea.   ?Respiratory:  Negative for cough and shortness of breath.   ?Cardiovascular:  Negative for chest pain.  ?Gastrointestinal:  Negative for constipation and diarrhea.  ?Genitourinary:  Negative for difficulty urinating and menstrual problem.  ?Musculoskeletal:  Positive for arthralgias.  ?Skin:  Negative for rash.  ?Allergic/Immunologic: Positive for environmental allergies.  ?Neurological:  Negative for dizziness and headaches.  ?Psychiatric/Behavioral:  The patient is not nervous/anxious.   ? ?   ? ? ?History reviewed. No pertinent past medical history. ? ?Social History  ? ?Socioeconomic History  ? Marital status: Married  ?  Spouse name: Not on file  ? Number of children: Not on file  ? Years of education: Not on file  ? Highest education level: Not on file  ?Occupational History  ? Not on file  ?Tobacco Use  ? Smoking status: Never  ? Smokeless tobacco: Never  ?Vaping Use  ? Vaping Use: Never used  ?Substance and Sexual Activity  ? Alcohol use: Yes  ? Drug use: Not on file  ? Sexual activity: Not on file  ?Other Topics Concern  ? Not on file  ?Social History Narrative  ? Not on file  ? ?Social Determinants of Health  ? ?Financial Resource Strain: Not on file  ?Food  Insecurity: Not on file  ?Transportation Needs: Not on file  ?Physical Activity: Not on file  ?Stress: Not on file  ?Social Connections: Not on file  ?Intimate Partner Violence: Not on file  ? ? ?History reviewed. No pertinent surgical history. ? ?Family History  ?Problem Relation Age of Onset  ? Breast cancer Mother 23  ? Alcohol abuse Paternal Grandfather   ? Lung cancer Paternal Grandfather   ? ? ?No Known Allergies ? ?Current Outpatient Medications on File Prior to Visit  ?Medication Sig Dispense Refill  ? meloxicam (MOBIC) 15 MG tablet Take 15 mg by mouth daily. (Patient not taking: Reported on 11/23/2021)    ? ?No current facility-administered medications on file prior to visit.  ? ? ?BP 120/72   Pulse 97   Ht 5\' 4"  (1.626 m)   Wt 133 lb 6.4 oz (60.5 kg)   LMP 11/11/2021   SpO2 98%   BMI 22.90 kg/m?  ?Objective:  ? Physical Exam ?HENT:  ?   Right Ear: Tympanic membrane and ear canal normal.  ?   Left Ear: Tympanic membrane and ear canal normal.  ?   Nose: Nose normal.  ?Eyes:  ?   Conjunctiva/sclera: Conjunctivae normal.  ?   Pupils: Pupils are equal, round, and reactive to light.  ?Neck:  ?   Thyroid: No thyromegaly.  ?Cardiovascular:  ?   Rate and Rhythm: Normal rate and regular rhythm.  ?  Heart sounds: No murmur heard. ?Pulmonary:  ?   Effort: Pulmonary effort is normal.  ?   Breath sounds: Normal breath sounds. No rales.  ?Abdominal:  ?   General: Bowel sounds are normal.  ?   Palpations: Abdomen is soft.  ?   Tenderness: There is no abdominal tenderness.  ?Musculoskeletal:     ?   General: Normal range of motion.  ?   Cervical back: Neck supple.  ?Lymphadenopathy:  ?   Cervical: No cervical adenopathy.  ?Skin: ?   General: Skin is warm and dry.  ?   Findings: No rash.  ?Neurological:  ?   Mental Status: She is alert and oriented to person, place, and time.  ?   Cranial Nerves: No cranial nerve deficit.  ?   Deep Tendon Reflexes: Reflexes are normal and symmetric.  ?Psychiatric:     ?   Mood and  Affect: Mood normal.  ? ? ? ? ? ?   ?Assessment & Plan:  ? ? ? ? ?This visit occurred during the SARS-CoV-2 public health emergency.  Safety protocols were in place, including screening questions prior to the visit, additional usage of staff PPE, and extensive cleaning of exam room while observing appropriate contact time as indicated for disinfecting solutions.  ?

## 2021-11-23 NOTE — Patient Instructions (Signed)
Stop by the lab prior to leaving today. I will notify you of your results once received.  ? ?[facility].  ? ?It was a pleasure to see you today! ? ?Preventive Care 44-44 Years Old, Female ?Preventive care refers to lifestyle choices and visits with your health care provider that can promote health and wellness. Preventive care visits are also called wellness exams. ?What can I expect for my preventive care visit? ?Counseling ?Your health care provider may ask you questions about your: ?Medical history, including: ?Past medical problems. ?Family medical history. ?Pregnancy history. ?Current health, including: ?Menstrual cycle. ?Method of birth control. ?Emotional well-being. ?Home life and relationship well-being. ?Sexual activity and sexual health. ?Lifestyle, including: ?Alcohol, nicotine or tobacco, and drug use. ?Access to firearms. ?Diet, exercise, and sleep habits. ?Work and work environment. ?Sunscreen use. ?Safety issues such as seatbelt and bike helmet use. ?Physical exam ?Your health care provider will check your: ?Height and weight. These may be used to calculate your BMI (body mass index). BMI is a measurement that tells if you are at a healthy weight. ?Waist circumference. This measures the distance around your waistline. This measurement also tells if you are at a healthy weight and may help predict your risk of certain diseases, such as type 2 diabetes and high blood pressure. ?Heart rate and blood pressure. ?Body temperature. ?Skin for abnormal spots. ?What immunizations do I need? ? ?Vaccines are usually given at various ages, according to a schedule. Your health care provider will recommend vaccines for you based on your age, medical history, and lifestyle or other factors, such as travel or where you work. ?What tests do I need? ?Screening ?Your health care provider may recommend screening tests for certain conditions. This may include: ?Lipid and cholesterol  levels. ?Diabetes screening. This is done by checking your blood sugar (glucose) after you have not eaten for a while (fasting). ?Pelvic exam and Pap test. ?Hepatitis B test. ?Hepatitis C test. ?HIV (human immunodeficiency virus) test. ?STI (sexually transmitted infection) testing, if you are at risk. ?Lung cancer screening. ?Colorectal cancer screening. ?Mammogram. Talk with your health care provider about when you should start having regular mammograms. This may depend on whether you have a family history of breast cancer. ?BRCA-related cancer screening. This may be done if you have a family history of breast, ovarian, tubal, or peritoneal cancers. ?Bone density scan. This is done to screen for osteoporosis. ?Talk with your health care provider about your test results, treatment options, and if necessary, the need for more tests. ?Follow these instructions at home: ?Eating and drinking ? ?Eat a diet that includes fresh fruits and vegetables, whole grains, lean protein, and low-fat dairy products. ?Take vitamin and mineral supplements as recommended by your health care provider. ?Do not drink alcohol if: ?Your health care provider tells you not to drink. ?You are pregnant, may be pregnant, or are planning to become pregnant. ?If you drink alcohol: ?Limit how much you have to 0-1 drink a day. ?Know how much alcohol is in your drink. In the [place] one drink equals one 12 oz bottle of beer (355 mL), one 5 oz glass of wine (148 mL), or one 1? oz glass of hard liquor (44 mL). ?Lifestyle ?Brush your teeth every morning and night with fluoride toothpaste. Floss one time each day. ?Exercise for at least 30 minutes 5 or more days each week. ?Do not use any products that contain nicotine or tobacco. These products include cigarettes, chewing   tobacco, and vaping devices, such as e-cigarettes. If you need help quitting, ask your health care provider. ?Do not use drugs. ?If you are sexually active, practice safe sex. Use a  condom or other form of protection to prevent STIs. ?If you do not wish to become pregnant, use a form of birth control. If you plan to become pregnant, see your health care provider for a prepregnancy visit. ?Take aspirin only as told by your health care provider. Make sure that you understand how much to take and what form to take. Work with your health care provider to find out whether it is safe and beneficial for you to take aspirin daily. ?Find healthy ways to manage stress, such as: ?Meditation, yoga, or listening to music. ?Journaling. ?Talking to a trusted person. ?Spending time with friends and family. ?Minimize exposure to UV radiation to reduce your risk of skin cancer. ?Safety ?Always wear your seat belt while driving or riding in a vehicle. ?Do not drive: ?If you have been drinking alcohol. Do not ride with someone who has been drinking. ?When you are tired or distracted. ?While texting. ?If you have been using any mind-altering substances or drugs. ?Wear a helmet and other protective equipment during sports activities. ?If you have firearms in your house, make sure you follow all gun safety procedures. ?Seek help if you have been physically or sexually abused. ?What's next? ?Visit your health care provider once a year for an annual wellness visit. ?Ask your health care provider how often you should have your eyes and teeth checked. ?Stay up to date on all vaccines. ?This information is not intended to replace advice given to you by your health care provider. Make sure you discuss any questions you have with your health care provider. ?Document Revised: 01/05/2021 Document Reviewed: 01/05/2021 ?Elsevier Patient Education ? 2023 [employer] ? ?

## 2021-11-23 NOTE — Assessment & Plan Note (Signed)
Tetanus up-to-date. ?Pap smear up-to-date, due November 2023. ?Mammogram due, orders placed. ? ?Commended her on a healthy lifestyle, encouraged to continue. ? ?Exam today unremarkable. ?Labs pending. ?

## 2021-12-22 ENCOUNTER — Ambulatory Visit
Admission: RE | Admit: 2021-12-22 | Discharge: 2021-12-22 | Disposition: A | Discharge status: Home / Self Care | Payer: BC Managed Care – PPO | Source: Ambulatory Visit | Attending: Primary Care | Admitting: Primary Care

## 2021-12-22 DIAGNOSIS — Z1231 Encounter for screening mammogram for malignant neoplasm of breast: Secondary | ICD-10-CM | POA: Insufficient documentation

## 2022-06-19 DIAGNOSIS — S46012D Strain of muscle(s) and tendon(s) of the rotator cuff of left shoulder, subsequent encounter: Secondary | ICD-10-CM | POA: Diagnosis not present

## 2022-07-13 DIAGNOSIS — S46012D Strain of muscle(s) and tendon(s) of the rotator cuff of left shoulder, subsequent encounter: Secondary | ICD-10-CM | POA: Diagnosis not present

## 2022-10-10 DIAGNOSIS — S46012D Strain of muscle(s) and tendon(s) of the rotator cuff of left shoulder, subsequent encounter: Secondary | ICD-10-CM | POA: Diagnosis not present

## 2022-12-20 DIAGNOSIS — M25512 Pain in left shoulder: Secondary | ICD-10-CM | POA: Diagnosis not present

## 2023-01-04 DIAGNOSIS — M25512 Pain in left shoulder: Secondary | ICD-10-CM | POA: Diagnosis not present

## 2023-01-16 ENCOUNTER — Other Ambulatory Visit: Payer: Self-pay | Admitting: Primary Care

## 2023-01-16 DIAGNOSIS — Z1231 Encounter for screening mammogram for malignant neoplasm of breast: Secondary | ICD-10-CM

## 2023-02-07 ENCOUNTER — Encounter: Payer: Self-pay | Admitting: Primary Care

## 2023-02-14 ENCOUNTER — Ambulatory Visit
Admission: RE | Admit: 2023-02-14 | Discharge: 2023-02-14 | Disposition: A | Discharge status: Home / Self Care | Payer: BC Managed Care – PPO | Source: Ambulatory Visit | Attending: Primary Care | Admitting: Primary Care

## 2023-02-14 DIAGNOSIS — Z1231 Encounter for screening mammogram for malignant neoplasm of breast: Secondary | ICD-10-CM | POA: Diagnosis not present

## 2023-02-19 ENCOUNTER — Encounter: Payer: Self-pay | Admitting: Primary Care

## 2023-02-19 ENCOUNTER — Other Ambulatory Visit (HOSPITAL_COMMUNITY)
Admission: RE | Admit: 2023-02-19 | Discharge: 2023-02-19 | Disposition: A | Discharge status: Home / Self Care | Payer: BC Managed Care – PPO | Source: Ambulatory Visit | Attending: Internal Medicine | Admitting: Internal Medicine

## 2023-02-19 ENCOUNTER — Ambulatory Visit (INDEPENDENT_AMBULATORY_CARE_PROVIDER_SITE_OTHER): Payer: BC Managed Care – PPO | Admitting: Primary Care

## 2023-02-19 VITALS — BP 128/82 | HR 88 | Temp 97.4°F | Ht 64.0 in | Wt 132.0 lb

## 2023-02-19 DIAGNOSIS — R232 Flushing: Secondary | ICD-10-CM

## 2023-02-19 DIAGNOSIS — Z124 Encounter for screening for malignant neoplasm of cervix: Secondary | ICD-10-CM

## 2023-02-19 DIAGNOSIS — M25512 Pain in left shoulder: Secondary | ICD-10-CM | POA: Diagnosis not present

## 2023-02-19 DIAGNOSIS — Z0000 Encounter for general adult medical examination without abnormal findings: Secondary | ICD-10-CM

## 2023-02-19 DIAGNOSIS — G8929 Other chronic pain: Secondary | ICD-10-CM

## 2023-02-19 LAB — COMPREHENSIVE METABOLIC PANEL
ALT: 12 U/L (ref 0–35)
AST: 21 U/L (ref 0–37)
Albumin: 4.8 g/dL (ref 3.5–5.2)
Alkaline Phosphatase: 44 U/L (ref 39–117)
BUN: 16 mg/dL (ref 6–23)
CO2: 29 mEq/L (ref 19–32)
Calcium: 9.9 mg/dL (ref 8.4–10.5)
Chloride: 100 mEq/L (ref 96–112)
Creatinine, Ser: 0.79 mg/dL (ref 0.40–1.20)
GFR: 90.33 mL/min (ref >60.00)
Glucose, Bld: 91 mg/dL (ref 70–99)
Potassium: 4.5 mEq/L (ref 3.5–5.1)
Sodium: 137 mEq/L (ref 135–145)
Total Bilirubin: 0.6 mg/dL (ref 0.2–1.2)
Total Protein: 7 g/dL (ref 6.0–8.3)

## 2023-02-19 LAB — CBC
HCT: 40.3 % (ref 36.0–46.0)
Hemoglobin: 13.3 g/dL (ref 12.0–15.0)
MCHC: 33.1 g/dL (ref 30.0–36.0)
MCV: 96 fl (ref 78.0–100.0)
Platelets: 272 10*3/uL (ref 150.0–400.0)
RBC: 4.2 Mil/uL (ref 3.87–5.11)
RDW: 12.4 % (ref 11.5–15.5)
WBC: 5.9 10*3/uL (ref 4.0–10.5)

## 2023-02-19 LAB — LIPID PANEL
Cholesterol: 183 mg/dL (ref 0–200)
HDL: 79.2 mg/dL (ref >39.00)
LDL Cholesterol: 89 mg/dL (ref 0–99)
NonHDL: 103.95
Total CHOL/HDL Ratio: 2
Triglycerides: 77 mg/dL (ref 0.0–149.0)
VLDL: 15.4 mg/dL (ref 0.0–40.0)

## 2023-02-19 LAB — TSH: TSH: 2.54 u[IU]/mL (ref 0.35–5.50)

## 2023-02-19 NOTE — Assessment & Plan Note (Signed)
Chronic and continued, suggestive of perimenopause.   Continue natural remedies.

## 2023-02-19 NOTE — Progress Notes (Signed)
Subjective:    Patient ID: Makayla Perry, female    DOB: 12/23/1975, 47 y.o.   MRN: [id number]  HPI  Makayla Perry is a very pleasant 47 y.o. female who presents today for complete physical and follow up of chronic conditions.  Immunizations: -Tetanus: Completed in 2014? Declines   Diet: Fair diet.  Exercise: Daily exercise, online body program.   Eye exam: Completed several years ago.  Dental exam: Completes semi-annually    Pap Smear: November 2020 Mammogram: Completed July 2024  Colonoscopy: Never completed, declines today.   BP Readings from Last 3 Encounters:  02/19/23 128/82  11/23/21 120/72  11/19/20 104/76        Review of Systems  Constitutional:  Negative for unexpected weight change.  HENT:  Negative for rhinorrhea.   Respiratory:  Negative for cough and shortness of breath.   Cardiovascular:  Negative for chest pain.  Gastrointestinal:  Negative for constipation and diarrhea.  Genitourinary:  Positive for menstrual problem. Negative for difficulty urinating.  Musculoskeletal:  Positive for arthralgias.  Skin:  Negative for rash.  Allergic/Immunologic: Negative for environmental allergies.  Neurological:  Positive for headaches. Negative for dizziness and numbness.  Psychiatric/Behavioral:  The patient is not nervous/anxious.        Mood swings         History reviewed. No pertinent past medical history.  Social History   Socioeconomic History   Marital status: Married    Spouse name: Not on file   Number of children: Not on file   Years of education: Not on file   Highest education level: Not on file  Occupational History   Not on file  Tobacco Use   Smoking status: Never   Smokeless tobacco: Never  Vaping Use   Vaping status: Never Used  Substance and Sexual Activity   Alcohol use: Yes   Drug use: Not on file   Sexual activity: Not on file  Other Topics Concern   Not on file  Social History Narrative   Not on file    Social Determinants of Health   Financial Resource Strain: Not on file  Food Insecurity: Not on file  Transportation Needs: Not on file  Physical Activity: Not on file  Stress: Not on file  Social Connections: Not on file  Intimate Partner Violence: Not on file    History reviewed. No pertinent surgical history.  Family History  Problem Relation Age of Onset   Breast cancer Mother 26   Alcohol abuse Paternal Grandfather    Lung cancer Paternal Grandfather     No Known Allergies  Current Outpatient Medications on File Prior to Visit  Medication Sig Dispense Refill   celecoxib (CELEBREX) 200 MG capsule Take 200 mg by mouth 2 (two) times daily as needed.     No current facility-administered medications on file prior to visit.    BP 128/82   Pulse 88   Temp (!) 97.4 F (36.3 C) (Temporal)   Ht 5\' 4"  (1.626 m)   Wt 132 lb (59.9 kg)   LMP 02/05/2023 (Approximate)   SpO2 99%   BMI 22.66 kg/m  Objective:   Physical Exam HENT:     Right Ear: Tympanic membrane and ear canal normal.     Left Ear: Tympanic membrane and ear canal normal.     Nose: Nose normal.  Eyes:     Conjunctiva/sclera: Conjunctivae normal.     Pupils: Pupils are equal, round, and reactive to light.  Neck:  Thyroid: No thyromegaly.  Cardiovascular:     Rate and Rhythm: Normal rate and regular rhythm.     Heart sounds: No murmur heard. Pulmonary:     Effort: Pulmonary effort is normal.     Breath sounds: Normal breath sounds. No rales.  Abdominal:     General: Bowel sounds are normal.     Palpations: Abdomen is soft.     Tenderness: There is no abdominal tenderness.  Musculoskeletal:        General: Normal range of motion.     Cervical back: Neck supple.  Lymphadenopathy:     Cervical: No cervical adenopathy.  Skin:    General: Skin is warm and dry.     Findings: No rash.  Neurological:     Mental Status: She is alert and oriented to person, place, and time.     Cranial Nerves: No  cranial nerve deficit.     Deep Tendon Reflexes: Reflexes are normal and symmetric.           Assessment & Plan:  Preventative health care Assessment & Plan: Tetanus due, she declines today Pap smear due, completed today Mammogram UTD Colonoscopy due, she declines today.   Discussed the importance of a healthy diet and regular exercise in order for weight loss, and to reduce the risk of further co-morbidity.  Exam stable. Labs pending.  Follow up in 1 year for repeat physical.   Orders: -     Lipid panel -     Comprehensive metabolic panel -     CBC -     TSH  Screening for cervical cancer -     Cytology - PAP  Chronic left shoulder pain Assessment & Plan: Following with orthopedics.  Continue Celebrex 200 mg daily.   No concerns today.    Hot flashes Assessment & Plan: Chronic and continued, suggestive of perimenopause.   Continue natural remedies.   Orders: -     CBC -     TSH        Doreene Nest, NP

## 2023-02-19 NOTE — Assessment & Plan Note (Signed)
Following with orthopedics.  Continue Celebrex 200 mg daily.   No concerns today.

## 2023-02-19 NOTE — Assessment & Plan Note (Signed)
Tetanus due, she declines today Pap smear due, completed today Mammogram UTD Colonoscopy due, she declines today.   Discussed the importance of a healthy diet and regular exercise in order for weight loss, and to reduce the risk of further co-morbidity.  Exam stable. Labs pending.  Follow up in 1 year for repeat physical.

## 2023-02-19 NOTE — Patient Instructions (Signed)
Stop by the lab prior to leaving today. I will notify you of your results once received.   It was a pleasure to see you today!  

## 2023-07-12 ENCOUNTER — Telehealth: Payer: Self-pay | Admitting: Physician Assistant

## 2023-07-12 DIAGNOSIS — R3989 Other symptoms and signs involving the genitourinary system: Secondary | ICD-10-CM

## 2023-07-12 MED ORDER — CEPHALEXIN 500 MG PO CAPS: 500.0000 mg | ORAL_CAPSULE | Freq: Two times a day (BID) | ORAL | 0 refills | Status: AC

## 2023-07-12 NOTE — Progress Notes (Signed)

## 2023-07-12 NOTE — Progress Notes (Signed)
I have spent 5 minutes in review of e-visit questionnaire, review and updating patient chart, medical decision making and response to patient.   Mia Milan Cody Jacklynn Dehaas, PA-C    

## 2024-05-13 ENCOUNTER — Ambulatory Visit: Payer: Self-pay | Admitting: Primary Care
